# Patient Record
Sex: Female | Born: 1965 | Race: White | Hispanic: No | Smoking: Never smoker
Health system: Southern US, Community
[De-identification: ages and names within clinical notes are randomized; demographics above are authoritative.]

## PROBLEM LIST (undated history)

## (undated) DIAGNOSIS — E039 Hypothyroidism, unspecified: Secondary | ICD-10-CM

## (undated) DIAGNOSIS — R519 Headache, unspecified: Secondary | ICD-10-CM

## (undated) DIAGNOSIS — C801 Malignant (primary) neoplasm, unspecified: Secondary | ICD-10-CM

## (undated) DIAGNOSIS — R51 Headache: Secondary | ICD-10-CM

## (undated) DIAGNOSIS — E7800 Pure hypercholesterolemia, unspecified: Secondary | ICD-10-CM

## (undated) HISTORY — PX: OTHER SURGICAL HISTORY: SHX169

## (undated) HISTORY — PX: DILATION AND CURETTAGE OF UTERUS: SHX78

---

## 1998-02-02 ENCOUNTER — Other Ambulatory Visit: Admission: RE | Admit: 1998-02-02 | Discharge: 1998-02-02 | Payer: Self-pay | Admitting: Obstetrics and Gynecology

## 1999-04-25 ENCOUNTER — Other Ambulatory Visit: Admission: RE | Admit: 1999-04-25 | Discharge: 1999-04-25 | Payer: Self-pay | Admitting: *Deleted

## 2000-07-03 ENCOUNTER — Other Ambulatory Visit: Admission: RE | Admit: 2000-07-03 | Discharge: 2000-07-03 | Payer: Self-pay | Admitting: *Deleted

## 2001-07-21 ENCOUNTER — Other Ambulatory Visit: Admission: RE | Admit: 2001-07-21 | Discharge: 2001-07-21 | Payer: Self-pay | Admitting: Obstetrics & Gynecology

## 2003-12-04 ENCOUNTER — Other Ambulatory Visit: Admission: RE | Admit: 2003-12-04 | Discharge: 2003-12-04 | Payer: Self-pay | Admitting: Family Medicine

## 2004-12-13 ENCOUNTER — Other Ambulatory Visit: Admission: RE | Admit: 2004-12-13 | Discharge: 2004-12-13 | Payer: Self-pay | Admitting: Family Medicine

## 2005-12-19 ENCOUNTER — Other Ambulatory Visit: Admission: RE | Admit: 2005-12-19 | Discharge: 2005-12-19 | Payer: Self-pay | Admitting: Family Medicine

## 2007-01-19 ENCOUNTER — Other Ambulatory Visit: Admission: RE | Admit: 2007-01-19 | Discharge: 2007-01-19 | Payer: Self-pay | Admitting: Obstetrics and Gynecology

## 2008-01-10 ENCOUNTER — Other Ambulatory Visit: Admission: RE | Admit: 2008-01-10 | Discharge: 2008-01-10 | Payer: Self-pay | Admitting: Family Medicine

## 2008-02-16 ENCOUNTER — Encounter: Admission: RE | Admit: 2008-02-16 | Discharge: 2008-02-16 | Payer: Self-pay | Admitting: Family Medicine

## 2008-12-22 ENCOUNTER — Encounter: Admission: RE | Admit: 2008-12-22 | Discharge: 2008-12-22 | Payer: Self-pay | Admitting: Family Medicine

## 2009-01-10 ENCOUNTER — Other Ambulatory Visit: Admission: RE | Admit: 2009-01-10 | Discharge: 2009-01-10 | Payer: Self-pay | Admitting: Family Medicine

## 2010-01-11 ENCOUNTER — Other Ambulatory Visit: Admission: RE | Admit: 2010-01-11 | Discharge: 2010-01-11 | Payer: Self-pay | Admitting: Family Medicine

## 2010-07-22 ENCOUNTER — Encounter: Payer: Self-pay | Admitting: Family Medicine

## 2012-01-21 ENCOUNTER — Other Ambulatory Visit: Payer: Self-pay | Admitting: Obstetrics and Gynecology

## 2012-01-21 ENCOUNTER — Other Ambulatory Visit (HOSPITAL_COMMUNITY)
Admission: RE | Admit: 2012-01-21 | Discharge: 2012-01-21 | Disposition: A | Discharge status: Home / Self Care | Payer: BC Managed Care – PPO | Source: Ambulatory Visit | Attending: Obstetrics and Gynecology | Admitting: Obstetrics and Gynecology

## 2012-01-21 DIAGNOSIS — Z01419 Encounter for gynecological examination (general) (routine) without abnormal findings: Secondary | ICD-10-CM | POA: Insufficient documentation

## 2014-07-06 ENCOUNTER — Other Ambulatory Visit: Payer: Self-pay | Admitting: Gastroenterology

## 2014-07-10 ENCOUNTER — Other Ambulatory Visit: Payer: Self-pay | Admitting: Gastroenterology

## 2014-07-10 ENCOUNTER — Encounter (HOSPITAL_COMMUNITY): Payer: Self-pay | Admitting: *Deleted

## 2014-07-18 ENCOUNTER — Ambulatory Visit (HOSPITAL_COMMUNITY): Payer: BC Managed Care – PPO | Admitting: Anesthesiology

## 2014-07-18 ENCOUNTER — Ambulatory Visit (HOSPITAL_COMMUNITY)
Admission: RE | Admit: 2014-07-18 | Discharge: 2014-07-18 | Disposition: A | Discharge status: Home / Self Care | Payer: BC Managed Care – PPO | Source: Ambulatory Visit | Attending: Gastroenterology | Admitting: Gastroenterology

## 2014-07-18 ENCOUNTER — Encounter (HOSPITAL_COMMUNITY)
Admission: RE | Disposition: A | Discharge status: Home / Self Care | Payer: Self-pay | Source: Ambulatory Visit | Attending: Gastroenterology

## 2014-07-18 ENCOUNTER — Encounter (HOSPITAL_COMMUNITY): Payer: Self-pay | Admitting: Anesthesiology

## 2014-07-18 DIAGNOSIS — Z1211 Encounter for screening for malignant neoplasm of colon: Secondary | ICD-10-CM | POA: Diagnosis present

## 2014-07-18 DIAGNOSIS — E039 Hypothyroidism, unspecified: Secondary | ICD-10-CM | POA: Insufficient documentation

## 2014-07-18 DIAGNOSIS — Z85850 Personal history of malignant neoplasm of thyroid: Secondary | ICD-10-CM | POA: Diagnosis not present

## 2014-07-18 DIAGNOSIS — D123 Benign neoplasm of transverse colon: Secondary | ICD-10-CM | POA: Insufficient documentation

## 2014-07-18 DIAGNOSIS — Z800 Family history of malignant neoplasm of digestive organs: Secondary | ICD-10-CM | POA: Diagnosis not present

## 2014-07-18 DIAGNOSIS — E780 Pure hypercholesterolemia: Secondary | ICD-10-CM | POA: Insufficient documentation

## 2014-07-18 HISTORY — DX: Hypothyroidism, unspecified: E03.9

## 2014-07-18 HISTORY — DX: Headache: R51

## 2014-07-18 HISTORY — DX: Pure hypercholesterolemia, unspecified: E78.00

## 2014-07-18 HISTORY — DX: Malignant (primary) neoplasm, unspecified: C80.1

## 2014-07-18 HISTORY — PX: COLONOSCOPY WITH PROPOFOL: SHX5780

## 2014-07-18 HISTORY — DX: Headache, unspecified: R51.9

## 2014-07-18 SURGERY — COLONOSCOPY WITH PROPOFOL
Anesthesia: Monitor Anesthesia Care

## 2014-07-18 MED ORDER — PROPOFOL 10 MG/ML IV EMUL
INTRAVENOUS | Status: DC | PRN
  Administered 2014-07-18: 20 mg via INTRAVENOUS
  Administered 2014-07-18: 30 mg via INTRAVENOUS
  Administered 2014-07-18: 40 mg via INTRAVENOUS
  Administered 2014-07-18: 20 mg via INTRAVENOUS

## 2014-07-18 MED ORDER — LACTATED RINGERS IV SOLN
INTRAVENOUS | Status: DC | PRN
  Administered 2014-07-18: 09:00:00 via INTRAVENOUS

## 2014-07-18 MED ORDER — PROPOFOL INFUSION 10 MG/ML OPTIME
INTRAVENOUS | Status: DC | PRN
  Administered 2014-07-18: 100 ug/kg/min via INTRAVENOUS

## 2014-07-18 MED ORDER — SODIUM CHLORIDE 0.9 % IV SOLN: INTRAVENOUS | Status: DC

## 2014-07-18 MED ORDER — PROPOFOL 10 MG/ML IV BOLUS
INTRAVENOUS | Status: AC
  Filled 2014-07-18: qty 20

## 2014-07-18 MED ORDER — LIDOCAINE HCL (CARDIAC) 20 MG/ML IV SOLN
INTRAVENOUS | Status: AC
  Filled 2014-07-18: qty 5

## 2014-07-18 SURGICAL SUPPLY — 22 items

## 2014-07-18 NOTE — Op Note (Signed)
Procedure: Baseline screening colonoscopy. Sister diagnosed with colon cancer at age 49  Endoscopist: Earle Gell  Premedication: Propofol administered by anesthesia  Procedure: The patient was placed in the left lateral decubitus position. Anal inspection and digital rectal exam were normal. The Pentax pediatric colonoscope was introduced into the rectum and advanced to the cecum. A normal-appearing appendiceal orifice was identified. A normal-appearing ileocecal valve was identified. Colonic preparation for the exam today was good. Withdrawal time was 9 minutes  Rectum. Normal. Retroflexed view of the distal rectum normal  Sigmoid colon and descending colon. Normal  Splenic flexure. Normal  Transverse colon. Normal  Hepatic flexure. A 3 mm sessile polyp was removed from the hepatic flexure with the cold biopsy forceps  Ascending colon. Normal  Cecum and ileocecal valve. Normal  Assessment: From the hepatic flexure, a 3 mm sessile polyp was removed with the cold biopsy forceps. Otherwise normal colonoscopy.  Recommendation: Schedule repeat colonoscopy in 5 years

## 2014-07-18 NOTE — Anesthesia Postprocedure Evaluation (Signed)
Anesthesia Post Note  Patient: Maria Walsh  Procedure(s) Performed: Procedure(s) (LRB): COLONOSCOPY WITH PROPOFOL (N/A)  Anesthesia type: MAC  Patient location: PACU  Post pain: Pain level controlled  Post assessment: Patient's Cardiovascular Status Stable  Last Vitals:  Filed Vitals:   07/18/14 1003  BP: 97/58  Pulse: 79  Temp: 37 C  Resp: 18    Post vital signs: Reviewed and stable  Level of consciousness: sedated  Complications: No apparent anesthesia complications

## 2014-07-18 NOTE — Transfer of Care (Signed)
Immediate Anesthesia Transfer of Care Note  Patient: Maria Walsh  Procedure(s) Performed: Procedure(s): COLONOSCOPY WITH PROPOFOL (N/A)  Patient Location: PACU  Anesthesia Type:MAC  Level of Consciousness: awake, alert  and oriented  Airway & Oxygen Therapy: Patient Spontanous Breathing and Patient connected to face mask oxygen  Post-op Assessment: Report given to PACU RN and Post -op Vital signs reviewed and stable  Post vital signs: Reviewed and stable  Complications: No apparent anesthesia complications

## 2014-07-18 NOTE — H&P (Signed)
  Procedure: Baseline screening colonoscopy. Sister diagnosed with colon cancer at age 49  History: The patient is a 49 year old female born August 21, 1965. She is scheduled to undergo a baseline screening colonoscopy with polypectomy to prevent colon cancer.  Medication allergies: None  Past medical history: Thyroidectomy for malignancy at age 61. Thyroid cancer. Hypothyroidism. Hypercholesterolemia. Cesarean section.  Exam: The patient is alert and lying comfortably on the endoscopy stretcher. Abdomen is soft and nontender to palpation. Lungs are clear to auscultation. Cardiac exam reveals a regular rhythm.  Plan: Proceed with baseline screening colonoscopy.

## 2014-07-18 NOTE — Anesthesia Preprocedure Evaluation (Addendum)
Anesthesia Evaluation  Patient identified by MRN, date of birth, ID band Patient awake    Reviewed: Allergy & Precautions, H&P , NPO status , Patient's Chart, lab work & pertinent test results  Airway Mallampati: I  TM Distance: >3 FB Neck ROM: full    Dental  (+) Teeth Intact, Dental Advidsory Given   Pulmonary neg pulmonary ROS,  breath sounds clear to auscultation        Cardiovascular negative cardio ROS  Rhythm:regular Rate:Normal     Neuro/Psych  Headaches, negative psych ROS   GI/Hepatic negative GI ROS, Neg liver ROS,   Endo/Other  Hypothyroidism   Renal/GU negative Renal ROS     Musculoskeletal   Abdominal   Peds  Hematology   Anesthesia Other Findings   Reproductive/Obstetrics negative OB ROS                             Anesthesia Physical Anesthesia Plan  ASA: II  Anesthesia Plan: MAC   Post-op Pain Management:    Induction:   Airway Management Planned:   Additional Equipment:   Intra-op Plan:   Post-operative Plan:   Informed Consent: I have reviewed the patients History and Physical, chart, labs and discussed the procedure including the risks, benefits and alternatives for the proposed anesthesia with the patient or authorized representative who has indicated his/her understanding and acceptance.   Dental Advisory Given  Plan Discussed with:   Anesthesia Plan Comments:         Anesthesia Quick Evaluation

## 2014-07-18 NOTE — Discharge Instructions (Signed)

## 2014-07-19 ENCOUNTER — Encounter (HOSPITAL_COMMUNITY): Payer: Self-pay | Admitting: Gastroenterology

## 2015-01-15 ENCOUNTER — Other Ambulatory Visit: Payer: Self-pay | Admitting: Family Medicine

## 2015-01-15 ENCOUNTER — Other Ambulatory Visit (HOSPITAL_COMMUNITY)
Admission: RE | Admit: 2015-01-15 | Discharge: 2015-01-15 | Disposition: A | Discharge status: Home / Self Care | Payer: BC Managed Care – PPO | Source: Ambulatory Visit | Attending: Family Medicine | Admitting: Family Medicine

## 2015-01-15 DIAGNOSIS — Z1151 Encounter for screening for human papillomavirus (HPV): Secondary | ICD-10-CM | POA: Insufficient documentation

## 2015-01-15 DIAGNOSIS — Z124 Encounter for screening for malignant neoplasm of cervix: Secondary | ICD-10-CM | POA: Diagnosis not present

## 2015-01-17 LAB — CYTOLOGY - PAP

## 2019-02-18 ENCOUNTER — Other Ambulatory Visit: Payer: Self-pay | Admitting: Family Medicine

## 2019-02-18 DIAGNOSIS — R17 Unspecified jaundice: Secondary | ICD-10-CM

## 2019-02-22 ENCOUNTER — Ambulatory Visit
Admission: RE | Admit: 2019-02-22 | Discharge: 2019-02-22 | Disposition: A | Discharge status: Home / Self Care | Payer: BC Managed Care – PPO | Source: Ambulatory Visit | Attending: Family Medicine | Admitting: Family Medicine

## 2019-02-22 DIAGNOSIS — R17 Unspecified jaundice: Secondary | ICD-10-CM

## 2019-04-18 ENCOUNTER — Other Ambulatory Visit: Payer: Self-pay | Admitting: Family Medicine

## 2019-04-18 ENCOUNTER — Ambulatory Visit
Admission: RE | Admit: 2019-04-18 | Discharge: 2019-04-18 | Disposition: A | Discharge status: Home / Self Care | Payer: BC Managed Care – PPO | Source: Ambulatory Visit | Attending: Family Medicine | Admitting: Family Medicine

## 2019-04-18 DIAGNOSIS — M79672 Pain in left foot: Secondary | ICD-10-CM

## 2019-08-11 ENCOUNTER — Ambulatory Visit: Payer: BC Managed Care – PPO

## 2020-03-05 IMAGING — US ULTRASOUND ABDOMEN LIMITED
1 series · 14 of 25 positions shown · non-contrast
Comparison: None.

CLINICAL DATA: Elevated bilirubin.

EXAM:
ULTRASOUND ABDOMEN LIMITED RIGHT UPPER QUADRANT

[Series 1: ultrasound abdomen limited · 0.11mm/px · 14 of 52 slices shown]
[im 1/52]
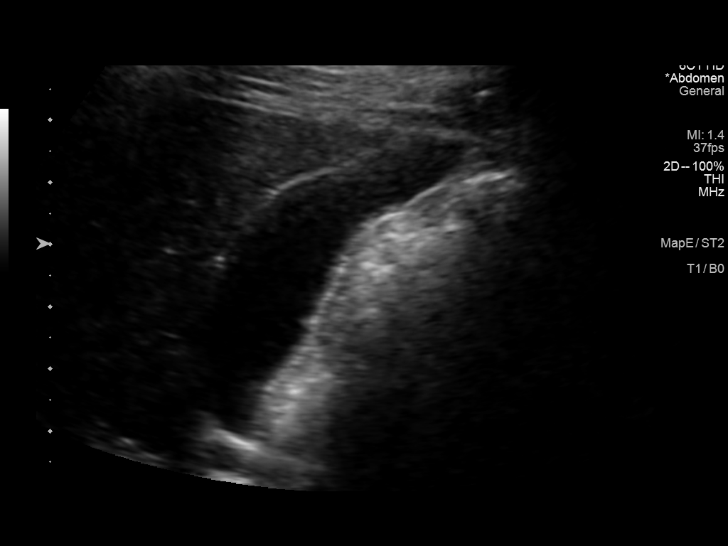
[im 5/52]
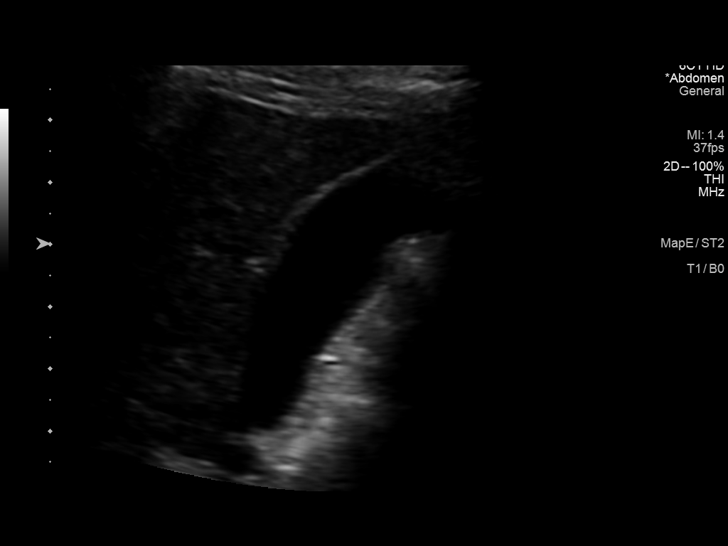
[im 9/52]
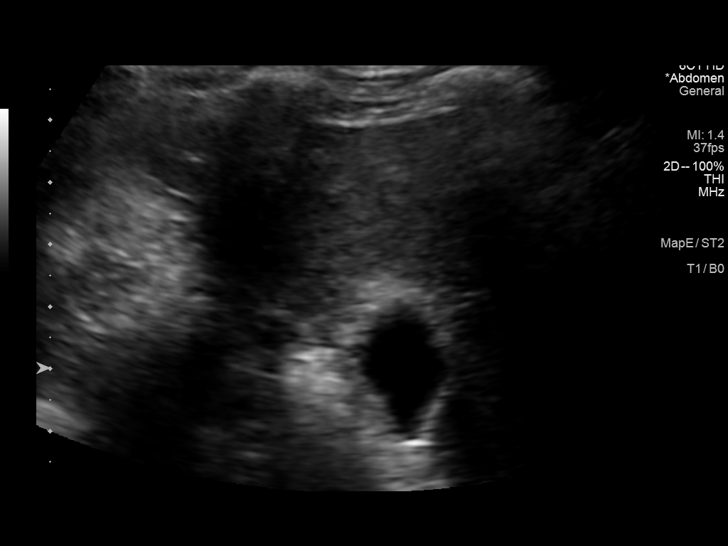
[im 13/52]
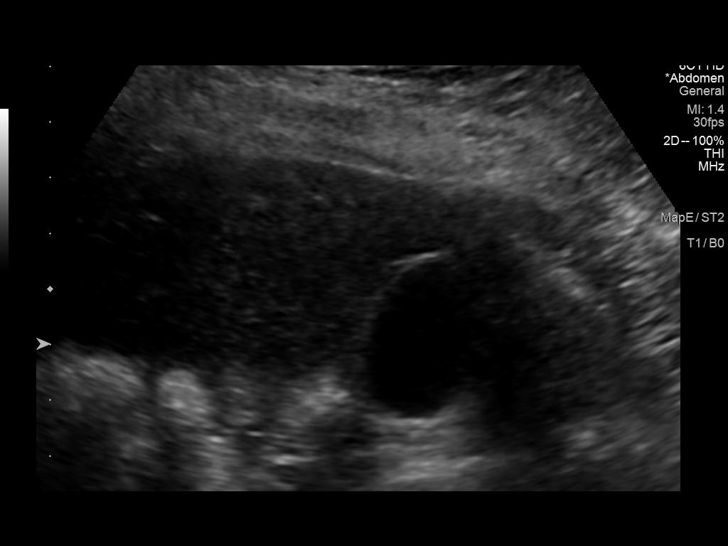
[im 18/52]
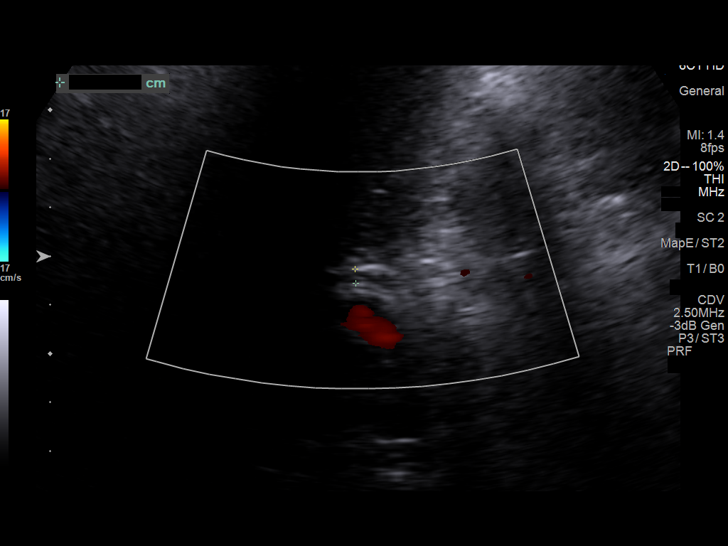
[im 20/52]
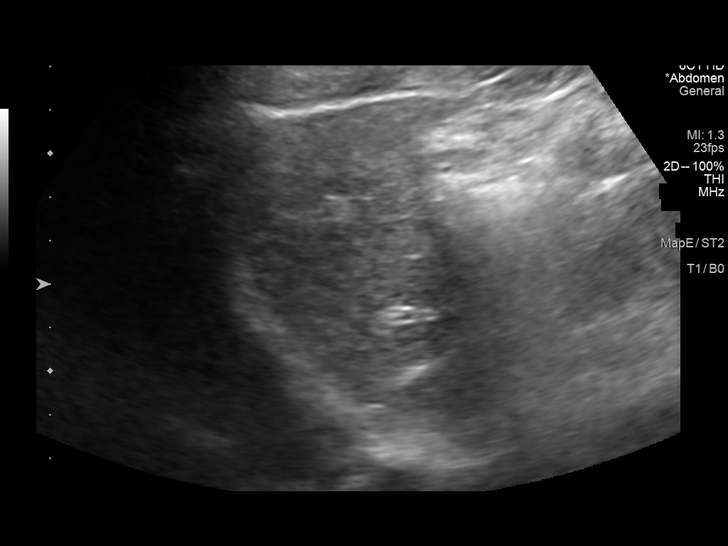
[im 24/52]
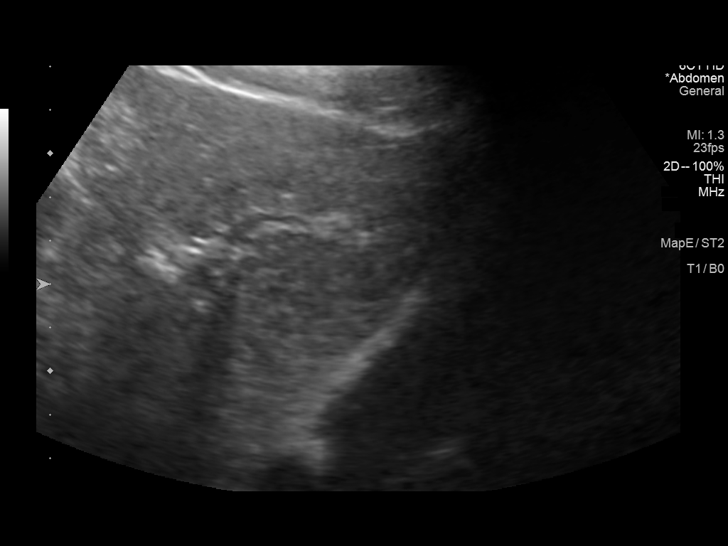
[im 28/52]
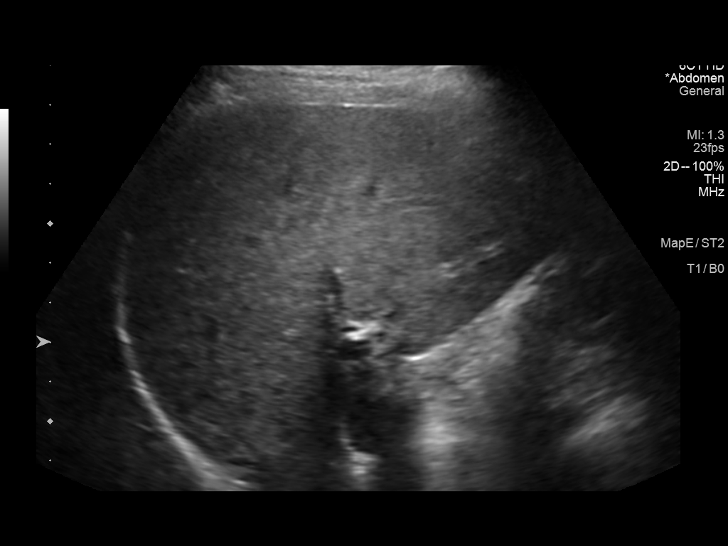
[im 32/52]
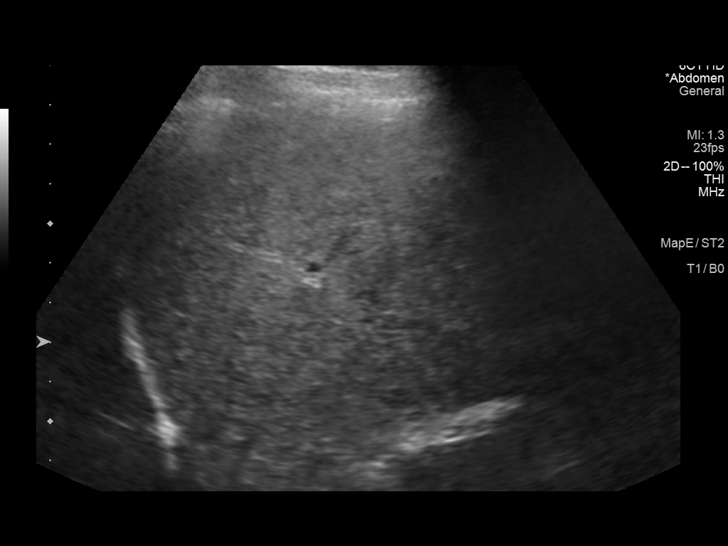
[im 35/52]
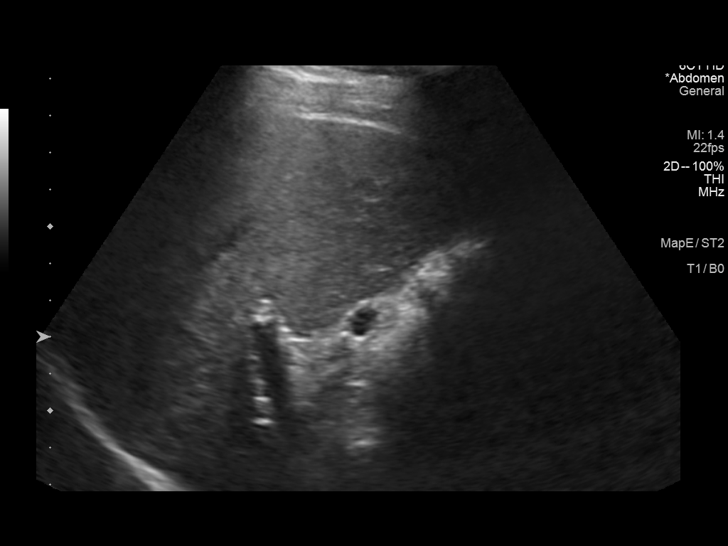
[im 39/52]
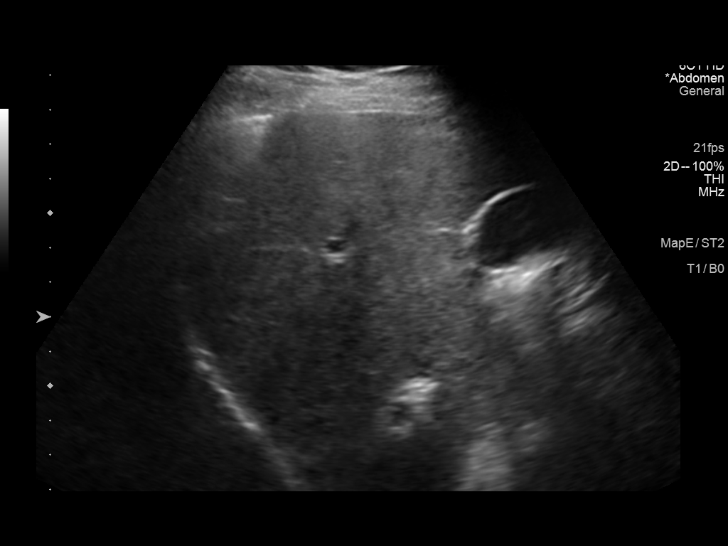
[im 43/52]
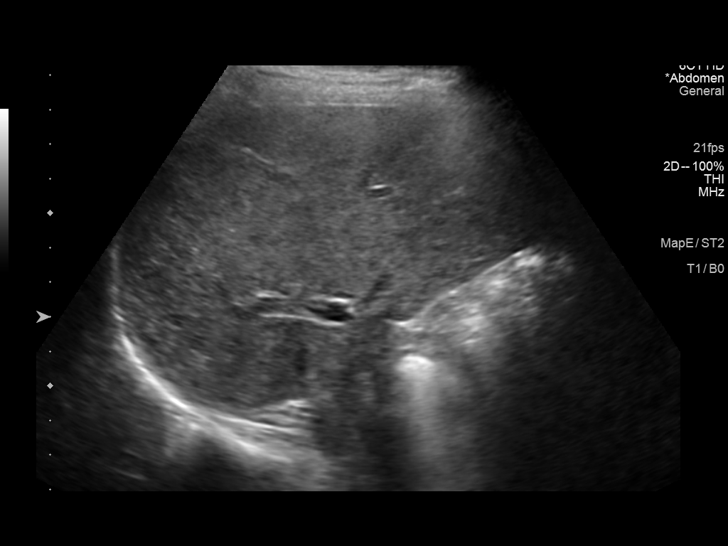
[im 47/52]
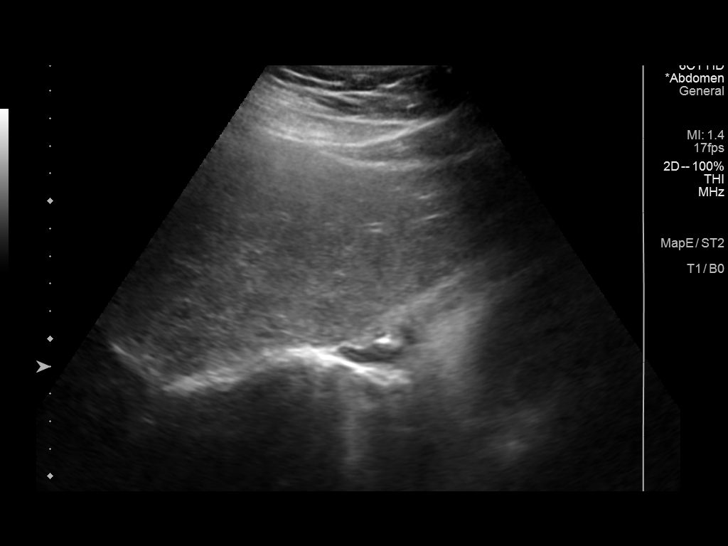
[im 52/52]
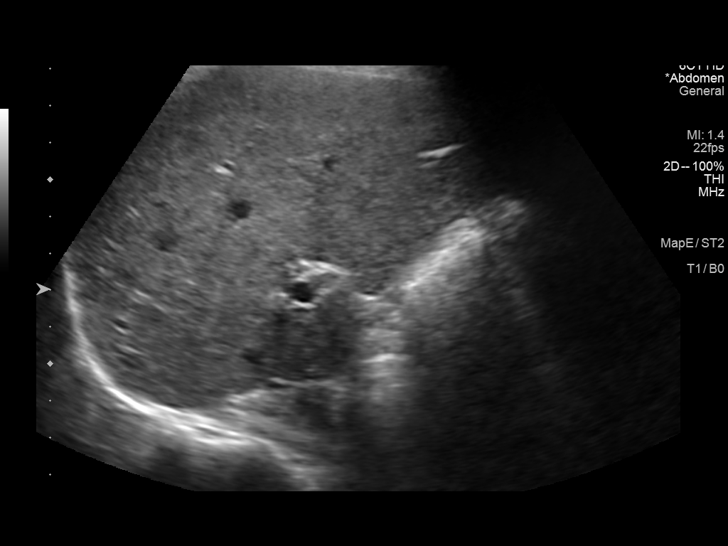

[14 of 25 positions shown; findings below may reference images not displayed]

FINDINGS: Gallbladder:

No gallstones or wall thickening visualized. No sonographic Murphy
sign noted by sonographer.

Common bile duct:

Diameter: 2.9 mm, normal.

Liver:

No focal lesion identified. Within normal limits in parenchymal
echogenicity. Portal vein is patent on color Doppler imaging with
normal direction of blood flow towards the liver.

Other: None.
IMPRESSION: Normal exam.

## 2020-04-29 IMAGING — CR DG FOOT COMPLETE 3+V*L*
3 series · 3 of 3 positions shown · non-contrast
Comparison: None.

CLINICAL DATA: Pain lateral left foot, no injury

EXAM:
LEFT FOOT - COMPLETE 3+ VIEW

[t foot ap left]
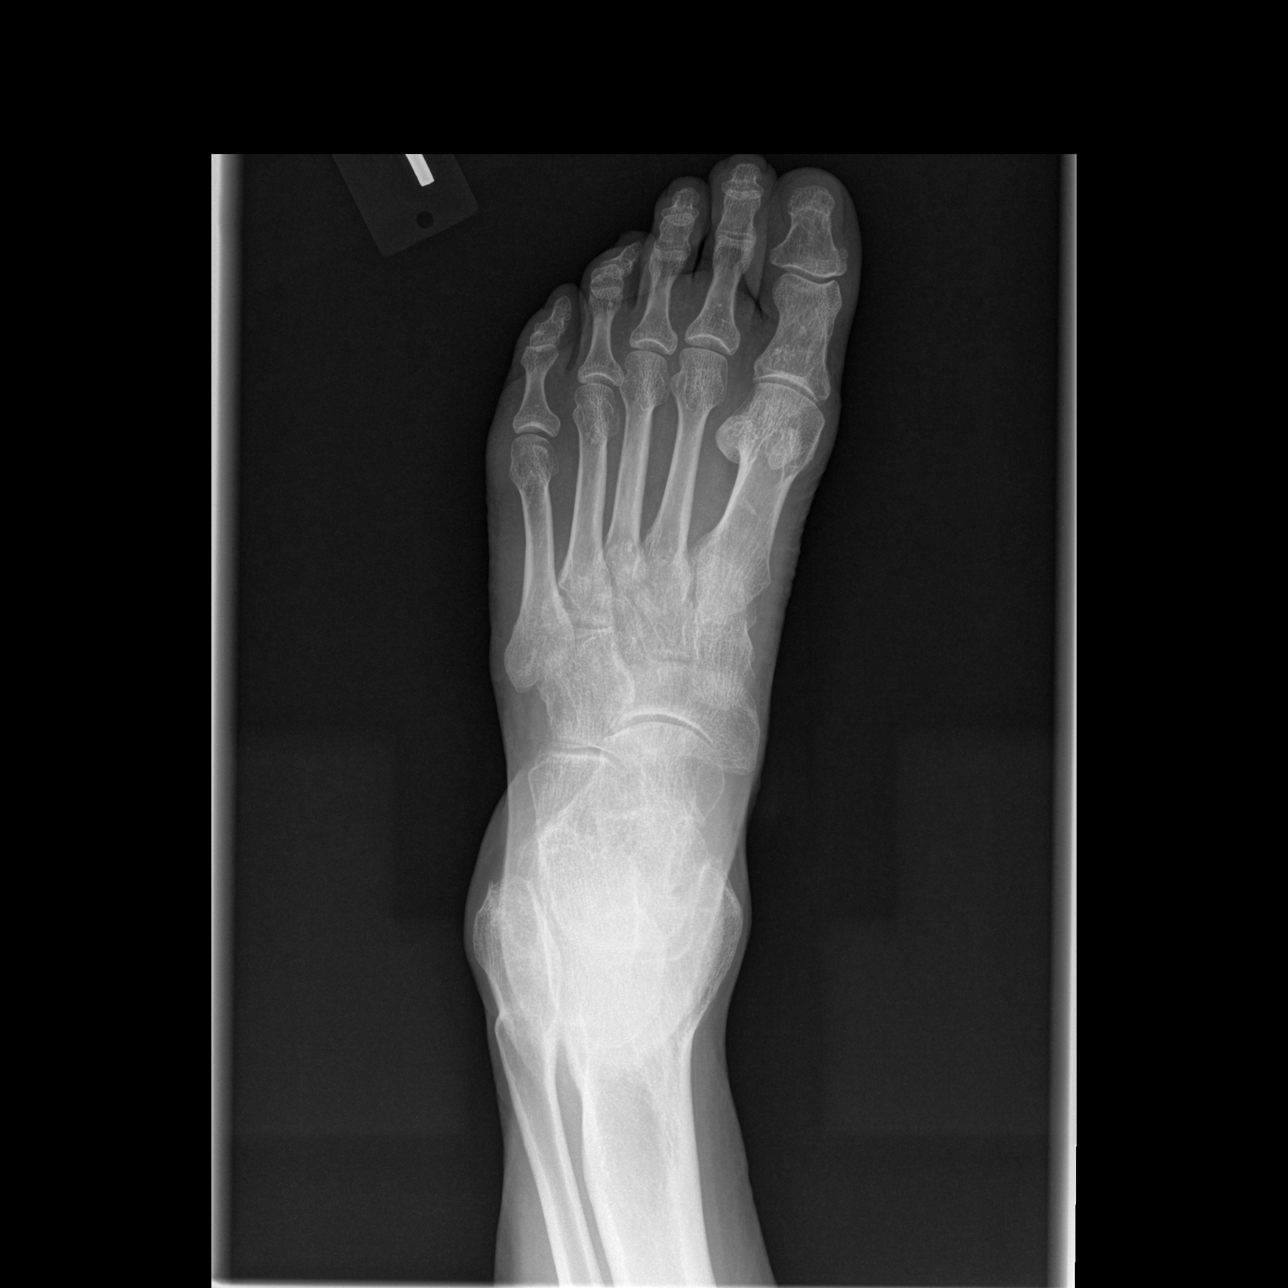

[t foot oblique left]
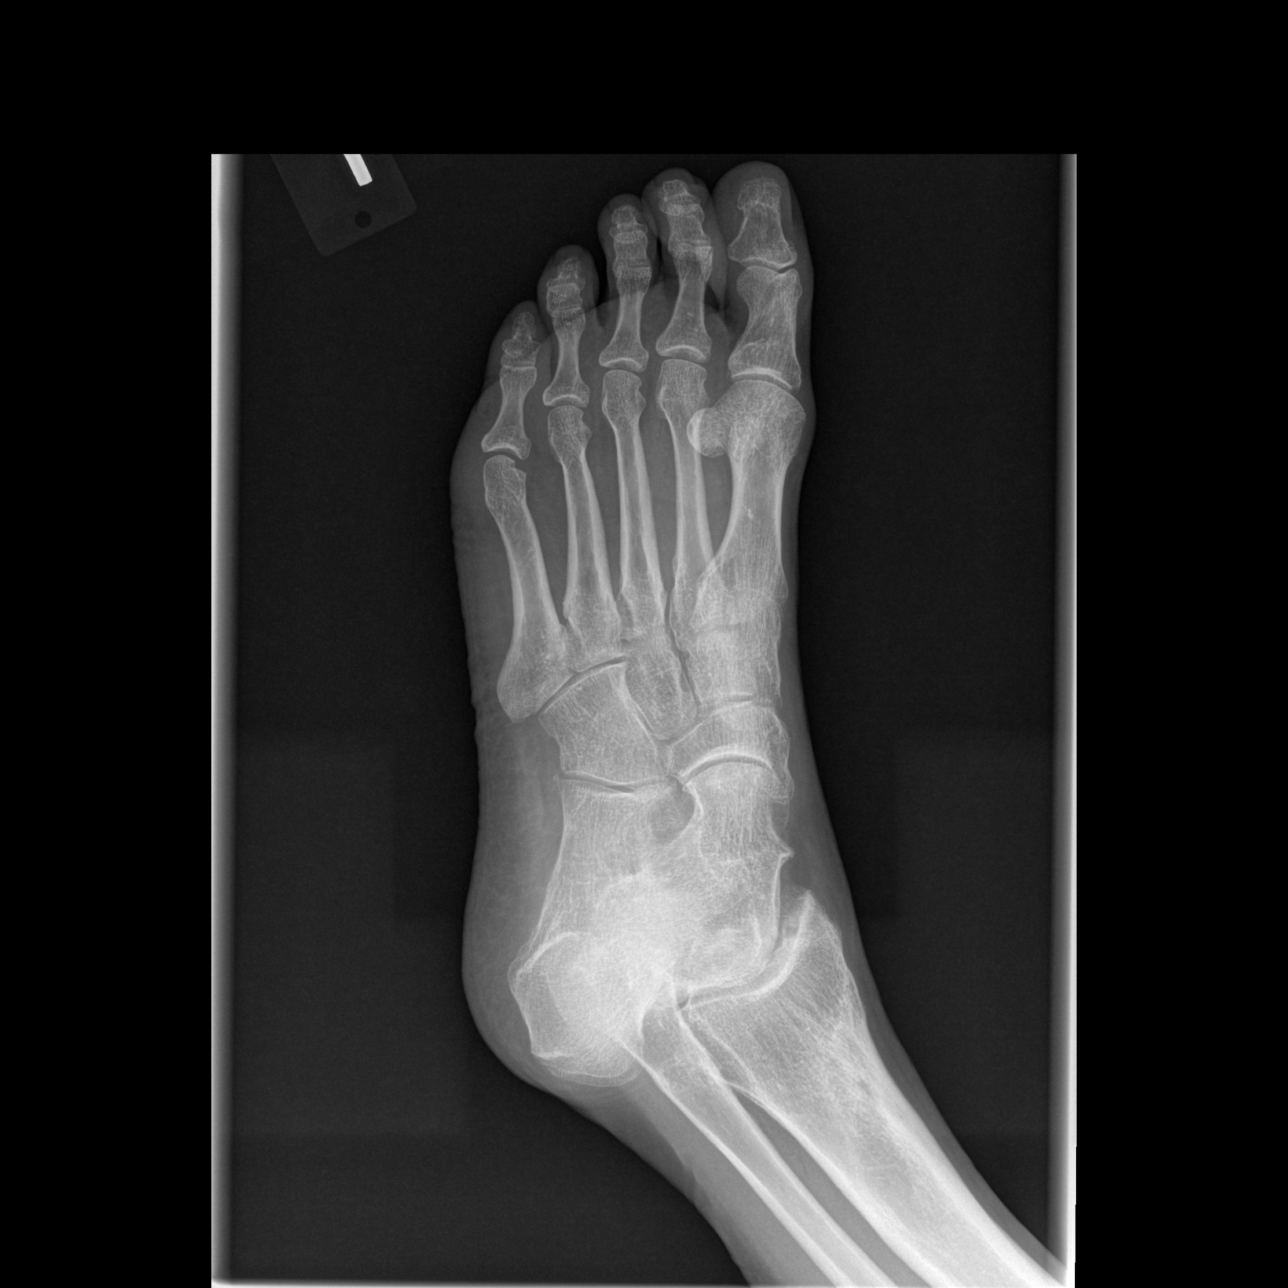

[t foot lat left]
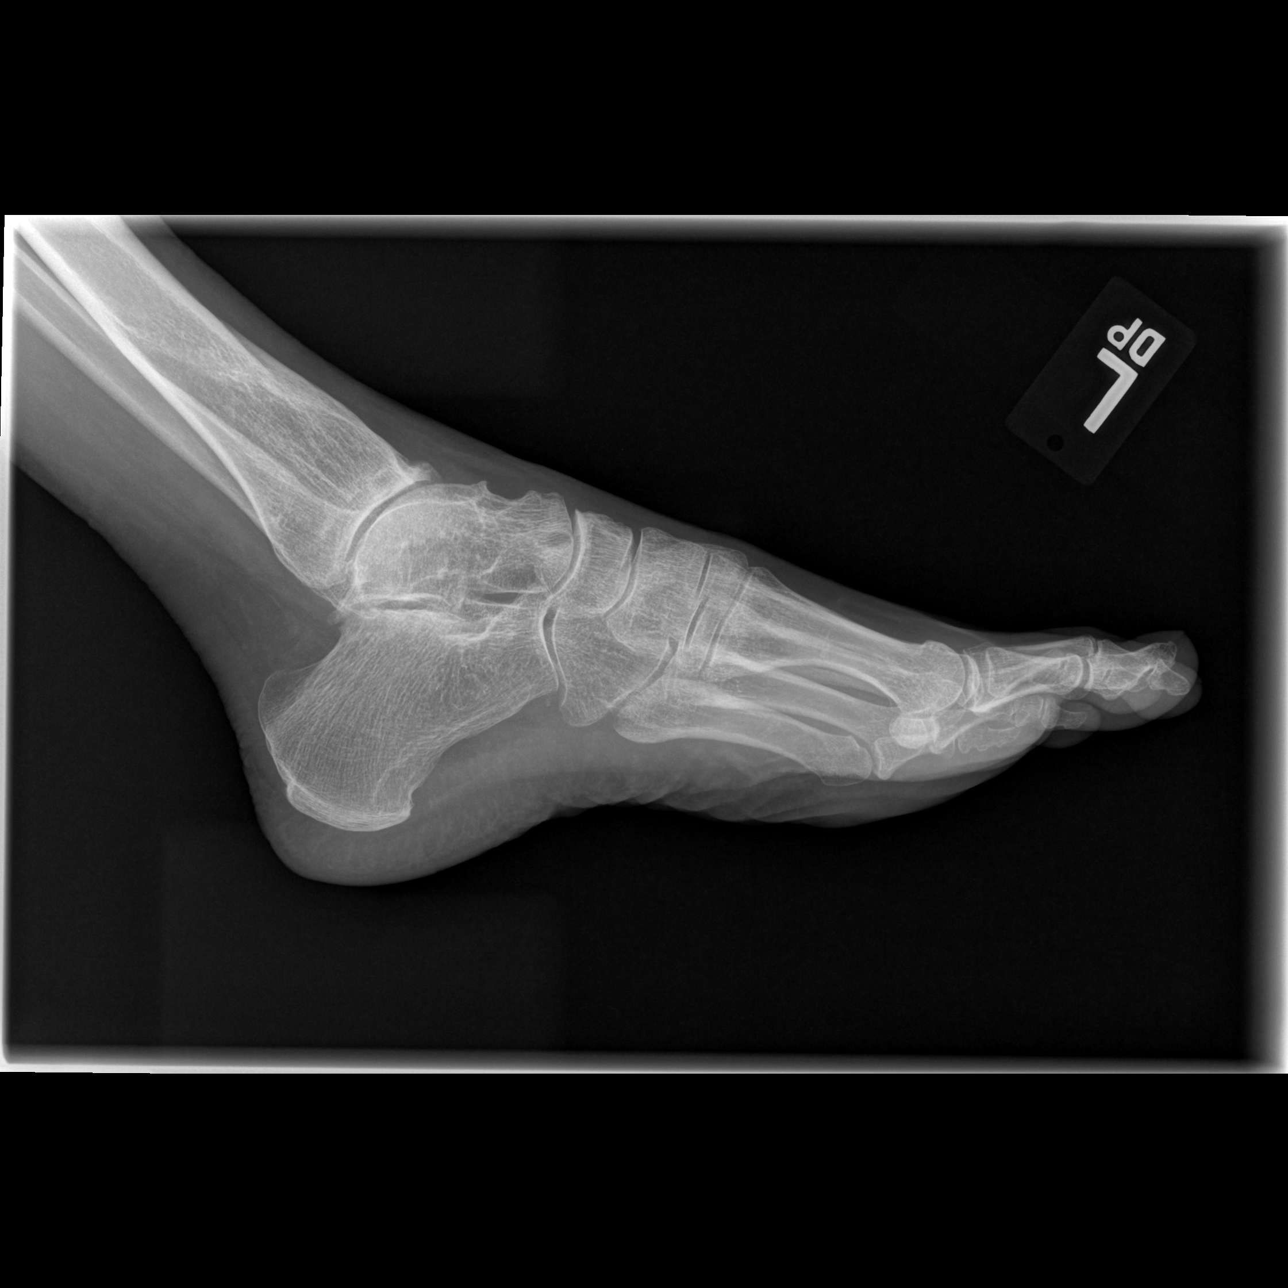

[3 of 3 positions shown; findings below may reference images not displayed]

FINDINGS: There is no evidence of fracture or dislocation. Scattered mild
degenerative changes. No focal bony lesion. Soft tissues are
unremarkable.
IMPRESSION: No acute osseous abnormality in the left foot.
